# Patient Record
Sex: Female | Born: 1975 | Hispanic: Yes | Marital: Single | Smoking: Former smoker
Health system: Southern US, Community
[De-identification: ages and names within clinical notes are randomized; demographics above are authoritative.]

## PROBLEM LIST (undated history)

## (undated) DIAGNOSIS — E039 Hypothyroidism, unspecified: Secondary | ICD-10-CM

## (undated) DIAGNOSIS — E079 Disorder of thyroid, unspecified: Secondary | ICD-10-CM

## (undated) HISTORY — PX: CERVICAL BIOPSY  W/ LOOP ELECTRODE EXCISION: SUR135

## (undated) HISTORY — PX: NO PAST SURGERIES: SHX2092

## (undated) HISTORY — PX: OVARIAN CYST DRAINAGE: SHX325

---

## 2013-10-24 ENCOUNTER — Other Ambulatory Visit: Payer: Self-pay | Admitting: Nurse Practitioner

## 2013-10-24 DIAGNOSIS — R102 Pelvic and perineal pain: Secondary | ICD-10-CM

## 2013-10-30 ENCOUNTER — Ambulatory Visit
Admission: RE | Admit: 2013-10-30 | Discharge: 2013-10-30 | Disposition: A | Discharge status: Home / Self Care | Payer: No Typology Code available for payment source | Source: Ambulatory Visit | Attending: Nurse Practitioner | Admitting: Nurse Practitioner

## 2013-10-30 DIAGNOSIS — R102 Pelvic and perineal pain: Secondary | ICD-10-CM

## 2017-07-23 ENCOUNTER — Other Ambulatory Visit (HOSPITAL_COMMUNITY): Payer: Self-pay | Admitting: *Deleted

## 2017-07-23 DIAGNOSIS — N644 Mastodynia: Secondary | ICD-10-CM

## 2017-08-12 ENCOUNTER — Ambulatory Visit
Admission: RE | Admit: 2017-08-12 | Discharge: 2017-08-12 | Disposition: A | Discharge status: Home / Self Care | Payer: No Typology Code available for payment source | Source: Ambulatory Visit | Attending: Obstetrics and Gynecology | Admitting: Obstetrics and Gynecology

## 2017-08-12 ENCOUNTER — Ambulatory Visit: Payer: No Typology Code available for payment source

## 2017-08-12 ENCOUNTER — Encounter (HOSPITAL_COMMUNITY): Payer: Self-pay

## 2017-08-12 ENCOUNTER — Ambulatory Visit (HOSPITAL_COMMUNITY)
Admission: RE | Admit: 2017-08-12 | Discharge: 2017-08-12 | Disposition: A | Discharge status: Home / Self Care | Payer: Self-pay | Source: Ambulatory Visit | Attending: Obstetrics and Gynecology | Admitting: Obstetrics and Gynecology

## 2017-08-12 ENCOUNTER — Other Ambulatory Visit (HOSPITAL_COMMUNITY): Payer: Self-pay | Admitting: Obstetrics and Gynecology

## 2017-08-12 VITALS — BP 112/76 | Temp 98.6°F | Wt 168.6 lb

## 2017-08-12 DIAGNOSIS — N644 Mastodynia: Secondary | ICD-10-CM

## 2017-08-12 DIAGNOSIS — N6489 Other specified disorders of breast: Secondary | ICD-10-CM

## 2017-08-12 DIAGNOSIS — Z01419 Encounter for gynecological examination (general) (routine) without abnormal findings: Secondary | ICD-10-CM

## 2017-08-12 HISTORY — DX: Disorder of thyroid, unspecified: E07.9

## 2017-08-12 NOTE — Progress Notes (Signed)
Complaints of bilateral breast pain x 2 months that comes and goes. Patient states the pain has decreased over the past 2-3 weeks. Patient rates the pain at a 4 out of 10.  Pap Smear: Pap smear completed today. Last Pap smear was 3 years ago at [facility] and normal per patient. Per patient has a history of an abnormal Pap smear 7-8 years ago that a colposcopy and LEEP were completed for follow-up. No Pap smear results are in Epic.  Physical exam: Breasts Breasts symmetrical. No skin abnormalities bilateral breasts. No nipple retraction bilateral breasts. No nipple discharge bilateral breasts. No lymphadenopathy. No lumps palpated bilateral breasts. Complaints of pain at left nipple area and at 2 o'clock 7 cm from the nipple on exam. Complaints of right nipple pain on exam. [facility]. Appointment scheduled for Thursday, August 12, 2017 at 0920.  Pelvic/Bimanual   Ext Genitalia No lesions, no swelling and no discharge observed on external genitalia.         Vagina Vagina pink and normal texture. No lesions or discharge observed in vagina.          Cervix Cervix is present. Cervix pink and of normal texture. No discharge observed.     Uterus Uterus is present and palpable. Uterus in normal position and normal size.        Adnexae Bilateral ovaries present and palpable. No tenderness on palpation.          Rectovaginal No rectal exam completed today since patient had no rectal complaints. No skin abnormalities observed on exam.    Smoking History: Patient is a former smoker that per patient quit 1.5 years ago.  Patient Navigation: Patient education provided. Access [facility] services [facility]. Spanish interpreter provided.  Used Spanish interpreter Nira ConnJulia Sowell over the phone.

## 2017-08-12 NOTE — Patient Instructions (Signed)
Explained breast self awareness with Melinda HousekeeperErika Krotz. Let patient know that if today's Pap smear is normal that her next Pap smear is due in one year due to only having one normal Pap smear since abnormal Pap smear. [facility] mamm[facility]. Appointment scheduled for Thursday, August 12, 2017 at 0920. Let patient know will follow up with her within the next couple weeks with results of Pap smear by letter or phone. Melinda Oneal verbalized understanding.  [name], Kathaleen Maserhristine Poll, RN 10:36 AM

## 2017-08-13 ENCOUNTER — Encounter (HOSPITAL_COMMUNITY): Payer: Self-pay | Admitting: *Deleted

## 2017-08-16 LAB — CYTOLOGY - PAP
DIAGNOSIS: NEGATIVE
DIAGNOSIS: REACTIVE
HPV: NOT DETECTED

## 2017-12-02 ENCOUNTER — Encounter (HOSPITAL_COMMUNITY): Payer: Self-pay | Admitting: *Deleted

## 2017-12-02 NOTE — Progress Notes (Signed)
Letter mailed to patient with negative pap smear. HPV was negative. Next pap smear due in five years.

## 2018-01-14 ENCOUNTER — Ambulatory Visit
Admission: RE | Admit: 2018-01-14 | Discharge: 2018-01-14 | Disposition: A | Discharge status: Home / Self Care | Payer: No Typology Code available for payment source | Source: Ambulatory Visit | Attending: Obstetrics and Gynecology | Admitting: Obstetrics and Gynecology

## 2018-01-14 DIAGNOSIS — N6489 Other specified disorders of breast: Secondary | ICD-10-CM

## 2018-06-13 ENCOUNTER — Other Ambulatory Visit: Payer: Self-pay

## 2018-06-13 ENCOUNTER — Inpatient Hospital Stay (HOSPITAL_COMMUNITY)
Admission: AD | Admit: 2018-06-13 | Discharge: 2018-06-13 | Disposition: A | Discharge status: Home / Self Care | Payer: No Typology Code available for payment source | Source: Ambulatory Visit | Attending: Obstetrics and Gynecology | Admitting: Obstetrics and Gynecology

## 2018-06-13 ENCOUNTER — Encounter (HOSPITAL_COMMUNITY): Payer: Self-pay | Admitting: *Deleted

## 2018-06-13 DIAGNOSIS — N764 Abscess of vulva: Secondary | ICD-10-CM

## 2018-06-13 DIAGNOSIS — L739 Follicular disorder, unspecified: Secondary | ICD-10-CM

## 2018-06-13 DIAGNOSIS — Z87891 Personal history of nicotine dependence: Secondary | ICD-10-CM | POA: Insufficient documentation

## 2018-06-13 DIAGNOSIS — R102 Pelvic and perineal pain: Secondary | ICD-10-CM | POA: Insufficient documentation

## 2018-06-13 HISTORY — DX: Hypothyroidism, unspecified: E03.9

## 2018-06-13 LAB — POCT PREGNANCY, URINE: Preg Test, Ur: NEGATIVE

## 2018-06-13 LAB — URINALYSIS, ROUTINE W REFLEX MICROSCOPIC
BILIRUBIN URINE: NEGATIVE
Glucose, UA: NEGATIVE mg/dL
KETONES UR: NEGATIVE mg/dL
LEUKOCYTES UA: NEGATIVE
NITRITE: NEGATIVE
PH: 6 (ref 5.0–8.0)
Protein, ur: NEGATIVE mg/dL
SPECIFIC GRAVITY, URINE: 1.01 (ref 1.005–1.030)

## 2018-06-13 LAB — WET PREP, GENITAL
Clue Cells Wet Prep HPF POC: NONE SEEN
SPERM: NONE SEEN
Trich, Wet Prep: NONE SEEN
YEAST WET PREP: NONE SEEN

## 2018-06-13 MED ORDER — CEPHALEXIN 500 MG PO CAPS: 500.0000 mg | ORAL_CAPSULE | Freq: Three times a day (TID) | ORAL | 0 refills | Status: AC

## 2018-06-13 NOTE — Discharge Instructions (Signed)
Foliculitis (Folliculitis) La foliculitis es una inflamacin de los folculos capilares. La foliculitis ocurre con mayor frecuencia en el cuero cabelludo, los muslos, las piernas, la espalda y las nalgas. Sin embargo, Visual merchandiser parte del cuerpo. CAUSAS Esta afeccin puede ser causada por lo siguiente:  Una infeccin bacteriana (frecuente).  Infecciones por hongos.  Infecciones virales.  Contacto con ciertas sustancias qumicas, especialmente aceites y alquitrn.  Rasurarse o depilarse.  Aplicacin frecuente de cremas o ungentos grasosos en la piel. La foliculitis de duracin prolongada y la foliculitis recurrente pueden ser causadas por bacterias que viven en las narinas. FACTORES DE RIESGO Es ms probable que esta afeccin se desarrolle en las personas que tengan lo siguiente:  Un sistema inmunitario debilitado.  Diabetes.  Obesidad. SNTOMAS Los sntomas de esta afeccin incluyen lo siguiente:  Enrojecimiento.  Inflamacin.  Hinchazn.  Picazn.  Pequeos puntos blancos o amarillos llenos de pus (pstulas) que pican y aparecen sobre una zona enrojecida. Si hay una infeccin que se adentra en el folculo, puede formarse un fornculo.  Un conjunto de fornculos agrupados estrechamente (carbunclo). Estos fornculos tienden a formarse en zonas del cuerpo con mucho pelo y sudorosas. DIAGNSTICO Esta afeccin se diagnostica con un examen de la piel. Para hallar la causa de la afeccin, el mdico puede tomar una [place] de uno de los fornculos o pstulas y Financial risk analyst. TRATAMIENTO El tratamiento de este trastorno puede incluir lo siguiente:  La aplicacin de compresas calientes en la zona afectada.  La administracin de antibiticos o la aplicacin de un antibitico a la piel.  La aplicacin de una solucin antisptica o darse un bao con esta solucin.  La administracin de un medicamento de venta libre para Production designer, theatre/television/film.  La realizacin de un  procedimiento para drenar las pstulas o los fornculos. Se puede realizar si una pstula o un fornculo contienen mucho pus o lquido.  La extraccin del pelo con lser. Se puede realizar para tratar una foliculitis de duracin prolongada. INSTRUCCIONES PARA EL CUIDADO EN EL HOGAR  Si se lo indican, aplique calor en la zona afectada tan frecuentemente como se lo haya indicado el mdico. Use la fuente de calor que el mdico le recomiende, como una compresa de calor hmedo o una almohadilla trmica. ? Coloque una [employer] piel y la fuente de Airline pilot. ? Aplique el calor durante 20 a . ? Retire la fuente de calor si la piel se le pone de color rojo brillante. Esto es muy importante si no puede sentir el dolor, el calor o el fro. Puede correr un riesgo mayor de sufrir quemaduras.  Si le recetaron un antibitico, tmelo como se lo haya indicado el mdico. No deje de usar el antibitico aunque comience a Actor.  Tome los medicamentos de venta libre y los recetados solamente como se lo haya indicado el mdico.  No rasure la piel irritada.  Concurra a todas las visitas de control como se lo haya indicado el mdico. Esto es importante. SOLICITE ATENCIN MDICA DE INMEDIATO SI:  Tiene ms enrojecimiento, hinchazn o dolor en la zona afectada.  Hay lneas rojas que se extienden desde la zona afectada.  Tiene fiebre. Esta informacin no tiene Theme park manager el consejo del mdico. Asegrese de hacerle al mdico cualquier pregunta que tenga. Document Released: 07/27/2005 Document Revised: 01/26/2012 Document Reviewed: 05/17/2015 Elsevier Interactive Patient Education  2018 [employer].   Inda Coke cutneo (Skin Abscess) Un absceso cutneo es una zona infectada en la piel o debajo  de esta que contiene pus y otras sustancias. Un absceso puede aparecer casi en cualquier lugar del cuerpo. Algunos abscesos se abren (rompen) solos. La mayora de ellos siguen empeorando, a  menos que se los trate. La infeccin puede diseminarse hacia otros sitios del cuerpo y en la Protection, lo que puede causar sensacin de Dentist. Generalmente, el tratamiento consiste en el drenaje del absceso. CUIDADOS EN EL HOGAR Cuidado del absceso  Si tiene un absceso que no ha supurado, [employer] un pao hmedo, tibio y limpio varias veces por Futures trader. Hgalo como se lo haya indicado el mdico.  Siga las indicaciones del mdico en lo que respecta al cuidado del absceso. Asegrese de lo siguiente: ? [place] el absceso con una venda (vendaje). ? Cambie la venda o la gasa como se lo haya indicado el mdico. ? Lvese las manos con agua y jabn antes de cambiar el vendaje o la gasa. Use un desinfectante para manos si no dispone de [place] y [place].  Contrlese el [employer] para detectar si la infeccin empeora. Est atento a los siguientes signos: ? Aumento del enrojecimiento, de la hinchazn o del dolor. ? Ms lquido [place]. ? Calor. ? Mal olor o aumento del pus. Medicamentos  [employer] de venta libre y los recetados solamente como se lo haya indicado el mdico.  Si le recetaron un antibitico, tmelo como se lo haya indicado el mdico. No deje de tomar los antibiticos aunque comience a sentirse mejor. Instrucciones generales  Para evitar la propagacin de la infeccin: ? No comparta artculos de higiene personal, toallas o jacuzzis con Economist. ? Evite el contacto con la piel de [employer].  Concurra a todas las visitas de control como se lo haya indicado el mdico. Esto es importante. SOLICITE AYUDA SI:  Aumentan el enrojecimiento, la hinchazn o el dolor alrededor del absceso.  Aumenta la cantidad de lquido o de sangre que sale del absceso.  Siente el absceso caliente cuando lo toca.  Aumenta la cantidad de pus o percibe mal [employer] del absceso.  Tiene fiebre.  Tiene dolor muscular.  Tiene escalofros.  Se siente  mal. SOLICITE AYUDA DE INMEDIATO SI:  Siente mucho dolor (intenso).  Observa lneas rojas que se extienden desde el absceso. Esta informacin no tiene Theme park manager el consejo del mdico. Asegrese de hacerle al mdico cualquier pregunta que tenga. Document Released: 10/23/2008 Document Revised: 01/26/2012 Document Reviewed: 06/05/2015 Elsevier Interactive Patient Education  [employer].

## 2018-06-13 NOTE — MAU Provider Note (Addendum)
History     CSN: [id number]  Arrival date and time: 06/13/18 1449   None     No chief complaint on file.  HPI Melinda Oneal is 42 y.o.  presenting with concern of a "pimple" on the vulva that began 1 week ago and swelling/pain in the vagina began yesterday.  Neg for fever, chills.  Neg for vaginal discharge.  1 sexual partner.  Was seen in Jan for breast, pelvic and pap at clinic.      Past Medical History:  Diagnosis Date  . Hypothyroidism   . Thyroid disease     Past Surgical History:  Procedure Laterality Date  . CERVICAL BIOPSY  W/ LOOP ELECTRODE EXCISION    . NO PAST SURGERIES    . OVARIAN CYST DRAINAGE      Family History  Problem Relation Age of Onset  . Cancer Mother        pancreatic    Social History   Tobacco Use  . Smoking status: Former Smoker    Last attempt to quit: 06/13/2016    Years since quitting: 2.0  . Smokeless tobacco: Never Used  Substance Use Topics  . Alcohol use: Yes    Comment: rarely  . Drug use: No    Allergies: No Known Allergies  Medications Prior to Admission  Medication Sig Dispense Refill Last Dose  . levothyroxine (SYNTHROID, LEVOTHROID) 100 MCG tablet Take 100 mcg by mouth daily before breakfast.       Review of Systems  Constitutional: Negative for chills and fever.  Genitourinary: Positive for vaginal pain. Negative for vaginal bleeding and vaginal discharge.       Painful area on the left side of vagina.  Introital discomfort Small pimple in the pubic hair.  Psychiatric/Behavioral: Negative for agitation.   Physical Exam   Blood pressure 120/75, pulse 75, temperature 99.1 F (37.3 C), temperature source Oral, resp. rate 18, weight 75.4 kg, SpO2 100 %.  Physical Exam  Nursing note and vitals reviewed. Constitutional: She is oriented to person, place, and time. She appears well-developed and well-nourished. No distress.  HENT:  Head: Normocephalic.  Neck: Normal range of motion.  Cardiovascular: Normal  rate.  Respiratory: Effort normal.  GI: Soft.  Genitourinary: Rectum normal. There is no rash, tenderness or lesion on the right labia. There is tenderness (there is a pea size firm nodule that is tender between the labia majora and minora without cellutlitis of the surrounding  tissue or oozing. ) on the left labia. There is no rash or lesion on the left labia. No erythema or tenderness in the vagina. No vaginal discharge found.  Genitourinary Comments: There is a small area under hair shaft in the suprapubic area that began to drain on exam.  Neg for cellulitis or tenderness.    Neurological: She is alert and oriented to person, place, and time.  Skin: Skin is warm and dry.  Psychiatric: She has a normal mood and affect. Her behavior is normal.   Results for orders placed or performed during the hospital encounter of 06/13/18 (from the past 24 hour(s))  Urinalysis, Routine w reflex microscopic     Status: Abnormal   Collection Time: 06/13/18  4:04 PM  Result Value Ref Range   Color, Urine YELLOW YELLOW   APPearance CLEAR CLEAR   Specific Gravity, Urine 1.010 1.005 - 1.030   pH 6.0 5.0 - 8.0   Glucose, UA NEGATIVE NEGATIVE mg/dL   Hgb urine dipstick SMALL (A) NEGATIVE  Bilirubin Urine NEGATIVE NEGATIVE   Ketones, ur NEGATIVE NEGATIVE mg/dL   Protein, ur NEGATIVE NEGATIVE mg/dL   Nitrite NEGATIVE NEGATIVE   Leukocytes, UA NEGATIVE NEGATIVE   RBC / HPF 0-5 0 - 5 RBC/hpf   WBC, UA 0-5 0 - 5 WBC/hpf   Bacteria, UA RARE (A) NONE SEEN   Squamous Epithelial / LPF 0-5 0 - 5   Mucus PRESENT   Pregnancy, urine POC     Status: None   Collection Time: 06/13/18  4:08 PM  Result Value Ref Range   Preg Test, Ur NEGATIVE NEGATIVE   MAU Course  Procedures  MDM MSE Exam  Assessment and Plan  A:  Left labia/vulvarl abscess without cellutlitis       Suprapubic folliculitis  P:  Instructed to soak in warm water 3 X day       Avoid squeezing or trying to "pop" the abscess       Rx for  Kelfex 500mg  tid X 7 days       Return for worsening sxs. Dennison Mascot [place] 06/13/2018, 5:09 PM

## 2018-06-13 NOTE — MAU Note (Signed)
Yesterday, she started with discomfort in 'her parts'.  Is swollen and it hurts.  Burning, esp when she urinates.

## 2018-06-14 LAB — GC/CHLAMYDIA PROBE AMP (~~LOC~~) NOT AT ARMC
CHLAMYDIA, DNA PROBE: NEGATIVE
NEISSERIA GONORRHEA: NEGATIVE

## 2018-10-17 ENCOUNTER — Telehealth: Payer: Self-pay | Admitting: Family Medicine

## 2018-10-17 NOTE — Telephone Encounter (Signed)
Called the patient to inform of upcoming appointment. Left a voicemail stating to the patient to call our office. Sending a reminder letter.

## 2018-10-18 ENCOUNTER — Telehealth: Payer: Self-pay | Admitting: Internal Medicine

## 2018-10-27 ENCOUNTER — Telehealth: Payer: Self-pay | Admitting: Family Medicine

## 2018-10-27 NOTE — Telephone Encounter (Signed)
The patient called in regards to the upcoming appointment. She wanted to know who sent the referral and stated she and the doctor had a different understanding of the referral however she will keep the appointment.

## 2018-10-28 ENCOUNTER — Encounter: Payer: Self-pay | Admitting: *Deleted

## 2018-11-09 ENCOUNTER — Encounter: Payer: No Typology Code available for payment source | Admitting: Family Medicine

## 2018-12-07 NOTE — Telephone Encounter (Signed)
Opened in error

## 2019-09-27 ENCOUNTER — Ambulatory Visit (INDEPENDENT_AMBULATORY_CARE_PROVIDER_SITE_OTHER): Payer: Self-pay | Admitting: Obstetrics and Gynecology

## 2019-09-27 ENCOUNTER — Other Ambulatory Visit: Payer: Self-pay

## 2019-09-27 ENCOUNTER — Encounter: Payer: Self-pay | Admitting: Obstetrics and Gynecology

## 2019-09-27 VITALS — Wt 148.0 lb

## 2019-09-27 DIAGNOSIS — Z1231 Encounter for screening mammogram for malignant neoplasm of breast: Secondary | ICD-10-CM

## 2019-09-27 DIAGNOSIS — G8929 Other chronic pain: Secondary | ICD-10-CM

## 2019-09-27 DIAGNOSIS — R1032 Left lower quadrant pain: Secondary | ICD-10-CM

## 2019-09-27 DIAGNOSIS — Z113 Encounter for screening for infections with a predominantly sexual mode of transmission: Secondary | ICD-10-CM

## 2019-09-27 NOTE — Progress Notes (Signed)
44 yo referred for evaluation of left lower quadrant pain. Patient reports the presence of this intermittent pain for the past 5 years. She states the pain is worst with her periods. It is mildly alleviated with ibuprofen. She reports a monthly period lasting 5 days. She denies any pain with intercourse. She admits that the pain seems to be triggered with movements of her left leg. Patient denies constipation  Past Medical History:  Diagnosis Date  . Hypothyroidism   . Thyroid disease    Past Surgical History:  Procedure Laterality Date  . CERVICAL BIOPSY  W/ LOOP ELECTRODE EXCISION    . NO PAST SURGERIES    . OVARIAN CYST DRAINAGE     Family History  Problem Relation Age of Onset  . Cancer Mother        pancreatic   Social History   Tobacco Use  . Smoking status: Former Smoker    Quit date: 06/13/2016    Years since quitting: 3.2  . Smokeless tobacco: Never Used  Substance Use Topics  . Alcohol use: Yes    Comment: rarely  . Drug use: No   ROS See pertinent in HPI  GENERAL: Well-developed, well-nourished female in no acute distress.  ABDOMEN: Soft, nontender, nondistended. No organomegaly. PELVIC: Normal external female genitalia. Vagina is pink and rugated.  Normal discharge. Normal appearing cervix. Uterus is normal in size.  No adnexal mass or tenderness. EXTREMITIES: No cyanosis, clubbing, or edema, 2+ distal pulses.  A/P 44 yo with LLQ pain - Normal pelvic exam - wet prep collected - pelvic ultrasound ordered - screening mammogram ordered - If normal ultrasound, discussed other potential etiologies such as a pinched nerve as her pain seems to be triggered by movement or her intestines - patient will be contacted with results

## 2019-09-28 LAB — CERVICOVAGINAL ANCILLARY ONLY
Bacterial Vaginitis (gardnerella): NEGATIVE
Candida Glabrata: NEGATIVE
Candida Vaginitis: NEGATIVE
Comment: NEGATIVE
Comment: NEGATIVE
Comment: NEGATIVE

## 2019-10-03 ENCOUNTER — Other Ambulatory Visit: Payer: Self-pay

## 2019-10-03 ENCOUNTER — Telehealth: Payer: Self-pay

## 2019-10-03 ENCOUNTER — Ambulatory Visit (HOSPITAL_COMMUNITY)
Admission: RE | Admit: 2019-10-03 | Discharge: 2019-10-03 | Disposition: A | Discharge status: Home / Self Care | Payer: Self-pay | Source: Ambulatory Visit | Attending: Obstetrics and Gynecology | Admitting: Obstetrics and Gynecology

## 2019-10-03 DIAGNOSIS — R1032 Left lower quadrant pain: Secondary | ICD-10-CM | POA: Insufficient documentation

## 2019-10-03 DIAGNOSIS — G8929 Other chronic pain: Secondary | ICD-10-CM | POA: Insufficient documentation

## 2019-10-03 NOTE — Telephone Encounter (Signed)
-----   Message from Catalina Antigua, MD sent at 10/03/2019  2:40 PM EST ----- Please inform patient of normal pelvic ultrasound with normal uterus and ovaries. She needs to follow up with a PCP for the evaluation of her chronic left lower quadrant pain  Thanks  [name]

## 2019-10-03 NOTE — Telephone Encounter (Signed)
Called pt using Spanish [employer]  id# [phone], to advise pt of [place] results, no answer, left VM for pt to call back to get results.

## 2019-10-14 ENCOUNTER — Ambulatory Visit: Payer: No Typology Code available for payment source | Attending: Internal Medicine

## 2019-10-14 DIAGNOSIS — Z23 Encounter for immunization: Secondary | ICD-10-CM | POA: Insufficient documentation

## 2019-10-14 NOTE — Progress Notes (Signed)
   Covid-19 Vaccination Clinic  Name:  Melinda Oneal    MRN: [id number] DOB: [birth year]  10/14/2019  Melinda Oneal was observed post Covid-19 immunization for 15 minutes and at the end of her post vaccination  check out verbalized a headache. She has a history of headaches and this one doesn't feel any different from previous ones. Feels like a throbbing in her frontal lobe, does not radiate. Denies N/V, C.P. or SOB. Vital signs: B/P- 131/74, P- 71, R- 18, O2 sat 99% on room air. 11:23am- States HA is resolved. Stood up on feet, no dizziness or lightheadedness, B/P 119/75,, P- 66, R- 18, O2 sat 100% on room air. 11:25am-discharged from vaccine clinic, states she feel fine, HA remains resolved.  She was provided with Vaccine Information Sheet and instruction to access the V-Safe system.   Melinda Oneal was instructed to call 911 with any severe reactions post vaccine: Marland Kitchen Difficulty breathing  . Swelling of face and throat  . A fast heartbeat  . A bad rash all over body  . Dizziness and weakness   Immunizations Administered    Name Date Dose VIS Date Route   Pfizer COVID-19 Vaccine 10/14/2019 10:54 AM 0.3 mL 07/21/2019 Intramuscular   Manufacturer: [employer], [employer]   Lot: [id number]   NDC: [address]-7

## 2019-11-04 ENCOUNTER — Ambulatory Visit: Payer: Self-pay | Attending: Internal Medicine

## 2019-11-04 DIAGNOSIS — Z23 Encounter for immunization: Secondary | ICD-10-CM

## 2019-11-04 NOTE — Progress Notes (Signed)
   Covid-19 Vaccination Clinic  Name:  Melinda Oneal    MRN: [id number] DOB: 06-20-1976  11/04/2019  Melinda Oneal was observed post Covid-19 immunization for 15 minutes without incident. She was provided with Vaccine Information Sheet and instruction to access the V-Safe system.   Melinda Oneal was instructed to call 911 with any severe reactions post vaccine: Marland Kitchen Difficulty breathing  . Swelling of face and throat  . A fast heartbeat  . A bad rash all over body  . Dizziness and weakness   Immunizations Administered    Name Date Dose VIS Date Route   Pfizer COVID-19 Vaccine 11/04/2019 11:55 AM 0.3 mL 07/21/2019 Intramuscular   Manufacturer: [employer], [employer]   Lot: [id number]   NDC: [address]-3

## 2019-11-14 ENCOUNTER — Other Ambulatory Visit: Payer: Self-pay | Admitting: Family Medicine

## 2019-11-14 DIAGNOSIS — R102 Pelvic and perineal pain: Secondary | ICD-10-CM

## 2019-11-15 ENCOUNTER — Ambulatory Visit: Payer: No Typology Code available for payment source

## 2019-11-30 ENCOUNTER — Other Ambulatory Visit: Payer: Self-pay | Admitting: Family Medicine

## 2019-12-11 ENCOUNTER — Ambulatory Visit
Admission: RE | Admit: 2019-12-11 | Discharge: 2019-12-11 | Disposition: A | Discharge status: Home / Self Care | Payer: No Typology Code available for payment source | Source: Ambulatory Visit | Attending: Family Medicine | Admitting: Family Medicine

## 2019-12-11 DIAGNOSIS — R102 Pelvic and perineal pain: Secondary | ICD-10-CM

## 2019-12-11 MED ORDER — IOPAMIDOL (ISOVUE-300) INJECTION 61%
100.0000 mL | Freq: Once | INTRAVENOUS | Status: AC | PRN
  Administered 2019-12-11: 100 mL via INTRAVENOUS

## 2020-02-21 ENCOUNTER — Other Ambulatory Visit: Payer: Self-pay | Admitting: *Deleted

## 2020-02-21 DIAGNOSIS — Z1231 Encounter for screening mammogram for malignant neoplasm of breast: Secondary | ICD-10-CM

## 2020-03-12 ENCOUNTER — Other Ambulatory Visit: Payer: Self-pay

## 2020-03-12 ENCOUNTER — Ambulatory Visit
Admission: RE | Admit: 2020-03-12 | Discharge: 2020-03-12 | Disposition: A | Discharge status: Home / Self Care | Payer: No Typology Code available for payment source | Source: Ambulatory Visit | Attending: Obstetrics and Gynecology | Admitting: Obstetrics and Gynecology

## 2020-03-12 DIAGNOSIS — Z1231 Encounter for screening mammogram for malignant neoplasm of breast: Secondary | ICD-10-CM

## 2021-06-11 ENCOUNTER — Other Ambulatory Visit: Payer: Self-pay

## 2021-06-11 DIAGNOSIS — Z1231 Encounter for screening mammogram for malignant neoplasm of breast: Secondary | ICD-10-CM

## 2021-07-29 ENCOUNTER — Ambulatory Visit: Payer: No Typology Code available for payment source

## 2021-08-14 ENCOUNTER — Ambulatory Visit: Payer: No Typology Code available for payment source

## 2021-08-28 ENCOUNTER — Ambulatory Visit: Payer: No Typology Code available for payment source

## 2021-09-04 ENCOUNTER — Ambulatory Visit
Admission: RE | Admit: 2021-09-04 | Discharge: 2021-09-04 | Disposition: A | Discharge status: Home / Self Care | Payer: No Typology Code available for payment source | Source: Ambulatory Visit | Attending: Obstetrics and Gynecology | Admitting: Obstetrics and Gynecology

## 2021-09-04 ENCOUNTER — Encounter (INDEPENDENT_AMBULATORY_CARE_PROVIDER_SITE_OTHER): Payer: Self-pay

## 2021-09-04 ENCOUNTER — Other Ambulatory Visit: Payer: Self-pay

## 2021-09-04 ENCOUNTER — Ambulatory Visit: Payer: Self-pay | Admitting: *Deleted

## 2021-09-04 VITALS — BP 110/68 | Wt 169.0 lb

## 2021-09-04 DIAGNOSIS — Z1231 Encounter for screening mammogram for malignant neoplasm of breast: Secondary | ICD-10-CM

## 2021-09-04 DIAGNOSIS — Z01419 Encounter for gynecological examination (general) (routine) without abnormal findings: Secondary | ICD-10-CM

## 2021-09-04 NOTE — Progress Notes (Signed)
[name] is a 46 y.o. [id number] female who presents to [facility] clinic today with complaint of left outer breast pain x 2-3 months that happens once every two weeks. Patient rates the pain at a 2-3 out of 10. On patients previous BCCCP exam 08/12/2017 she complained of bilateral breast pain. Patient had a diagnostic mammogram and left breast ultrasound 08/12/2017 for follow up that 42-month follow up was recommended for probably benign breast asymmetry. Patient had a left breast diagnostic mammogram completed 01/14/2018 for follow up that was negative. Last mammogram was completed 03/12/2020 and was negative.   Pap Smear: Pap smear completed today. Last Pap smear was 08/12/2017 in [facility] clinic that was normal with negative HPV. Per patient her previous Pap smear was completed around 2016 at [facility] and normal per patient. Per patient has a history of an abnormal Pap smear around 2011 that a colposcopy and LEEP were completed for follow-up. Patient stated she has only had two normal Pap smears since LEEP. Last Pap smear result is available in Epic.   Physical exam: Breasts Breasts symmetrical. No skin abnormalities bilateral breasts. No nipple retraction bilateral breasts. No nipple discharge bilateral breasts. No lymphadenopathy. No lumps palpated bilateral breasts. Complaints of left lower breast tenderness on exam.  MM DIAG BREAST TOMO UNI LEFT  Result Date: 01/14/2018 CLINICAL DATA:  46 year old patient presents for follow-up of probably benign asymmetry in the slightly medial/central left breast seen in the CC view on her baseline mammogram August 12, 2017. Spanish interpreter was present to give the patient's results to her today. EXAM: DIGITAL DIAGNOSTIC UNILATERAL LEFT MAMMOGRAM WITH CAD AND TOMO COMPARISON:  August 12, 2017 ACR Breast Density Category b: There are scattered areas of fibroglandular density. FINDINGS: The previously described asymmetry in the slightly medial/central left breast in  January 2019 is not apparent today. Normal scattered fibroglandular densities are seen in the left breast. No mass, persistent asymmetry, or architectural distortion is identified. There are no suspicious microcalcifications Mammographic images were processed with CAD. IMPRESSION: No evidence of malignancy in the left breast RECOMMENDATION: Bilateral screening mammogram is recommended in January 2020 I have discussed the findings and recommendations with the patient. Results were also provided in writing at the conclusion of the visit. If applicable, a reminder letter will be sent to the patient regarding the next appointment. BI-RADS CATEGORY  1: Negative. Electronically Signed   By: Britta Mccreedy M.D.   On: 01/14/2018 10:37   MM DIAG BREAST TOMO BILATERAL  Result Date: 08/12/2017 CLINICAL DATA:  46 year old female with an area of pain involving the outer left breast as well as retroareolar left breast. EXAM: 2D DIGITAL DIAGNOSTIC BILATERAL MAMMOGRAM WITH CAD AND ADJUNCT TOMO LEFT BREAST ULTRASOUND COMPARISON:  None. ACR Breast Density Category b: There are scattered areas of fibroglandular density. FINDINGS: No suspicious masses or calcifications seen in the right breast. There is an asymmetry in the central to slightly inner left breast best visualized on the CC view. Spot compression tangential tomograms were performed over the most focal pain in the upper-outer left breast with no abnormalities identified. Mammographic images were processed with CAD. Targeted ultrasound of the left breast was performed. No suspicious masses or abnormalities identified in the region of tenderness in the outer left breast. Targeted ultrasound of the entire inner left breast from the approximate 7 to 11 o'clock position was performed with only heterogeneous fibroglandular tissue identified. IMPRESSION: 1. No mammographic or sonographic correlate for the tenderness involving the outer left breast and  extending to the  retroareolar left breast. 2. There are no findings of malignancy in either breast. 3. Probably benign asymmetry in the central to slightly inner left breast likely representing focal fibroglandular tissue. RECOMMENDATION: Diagnostic mammography of the left breast with left breast ultrasound in 6 months to demonstrate stability of the benign appearing asymmetry seen in the slightly inner left breast best visualized on the CC view. I have discussed the findings and recommendations with the patient. Results were also provided in writing at the conclusion of the visit. If applicable, a reminder letter will be sent to the patient regarding the next appointment. BI-RADS CATEGORY  3: Probably benign. Electronically Signed   By: Edwin Cap M.D.   On: 08/12/2017 10:25   MS DIGITAL SCREENING TOMO BILATERAL  Result Date: 03/14/2020 CLINICAL DATA:  Screening. EXAM: DIGITAL SCREENING BILATERAL MAMMOGRAM WITH TOMO AND CAD COMPARISON:  Previous exam(s). ACR Breast Density Category b: There are scattered areas of fibroglandular density. FINDINGS: There are no findings suspicious for malignancy. Images were processed with CAD. IMPRESSION: No mammographic evidence of malignancy. A result letter of this screening mammogram will be mailed directly to the patient. RECOMMENDATION: Screening mammogram in one year. (Code:[id number]-B-01Y) BI-RADS CATEGORY  1: Negative. Electronically Signed   By: Amie Portland M.D.   On: 03/14/2020 11:08       Pelvic/Bimanual Ext Genitalia No lesions, no swelling and no discharge observed on external genitalia.        Vagina Vagina pink and normal texture. No lesions or discharge observed in vagina.        Cervix Cervix is present. Cervix pink and of normal texture. No discharge observed.    Uterus Uterus is present and palpable. Uterus in normal position and normal size.        Adnexae Bilateral ovaries present and palpable. No tenderness on palpation.         Rectovaginal No rectal  exam completed today since patient had no rectal complaints. No skin abnormalities observed on exam.     Smoking History: Patient is a former smoker that quit 06/13/2016.   Patient Navigation: Patient education provided. Access to services provided for patient through [place] program. Spanish interpreter Natale Lay from [facility] provided.   Colorectal Cancer Screening: Per patient has never had colonoscopy completed. Per patient completed a FIT Test at her PCP's office in July 2022. No complaints today.    Breast and Cervical Cancer Risk Assessment: Patient does not have family history of breast cancer, known genetic mutations, or radiation treatment to the chest before age 80. Per patient has history of cervical dysplasia. Patient has no history of being immunocompromised or DES exposure in-utero.  Risk Assessment     Risk Scores       09/04/2021   Last edited by: Narda Rutherford, LPN   5-year risk: 0.8 %   Lifetime risk: 8 %           A: BCCCP exam with pap smear Complaint of left breast pain.  P: Referred patient to the [facility] of [facility] for a screening mammogram on mobile unit. Appointment scheduled Thursday, September 04, 2021 at 1400.  Priscille Heidelberg, RN 09/04/2021 1:33 PM

## 2021-09-04 NOTE — Patient Instructions (Signed)
Explained breast self awareness with Melinda Oneal. Pap smear completed today. Let her know that her next Pap smear will due based on the result of today's Pap smear. Referred patient to the [facility] of [facility] for a screening mammogram on mobile unit. Appointment scheduled Thursday, September 04, 2021 at 1400. Patient aware of appointment and will be there. Let patient know will follow up with her within the next couple weeks with results of her Pap smear by phone. Informed patient that the [facility] will follow up with her within the next couple of weeks with results of her mammogram by letter or phone. Melinda Oneal verbalized understanding.  Melinda Oneal, [name], RN 1:33 PM

## 2021-09-08 LAB — CYTOLOGY - PAP
Comment: NEGATIVE
Diagnosis: NEGATIVE
High risk HPV: NEGATIVE

## 2021-09-09 ENCOUNTER — Telehealth: Payer: Self-pay

## 2021-09-09 NOTE — Telephone Encounter (Signed)
Via Natale Lay, Spanish Interpreter, Patient informed negative Pap/HPV results, next pap due in 3 years. Patient verbalized understanding.

## 2022-02-14 IMAGING — MG MM DIGITAL SCREENING BILAT W/ TOMO AND CAD
6 of 10 series · 6 of 30 positions shown · non-contrast
Comparison: Previous exam(s).

CLINICAL DATA: Screening. Technologist indicates best
positioning/images possible.

EXAM:
DIGITAL SCREENING BILATERAL MAMMOGRAM WITH TOMOSYNTHESIS AND CAD
TECHNIQUE: Bilateral screening digital craniocaudal and mediolateral oblique
mammograms were obtained. Bilateral screening digital breast
tomosynthesis was performed. The images were evaluated with
computer-aided detection.

[L MLO synth-2D (1 of 2)]
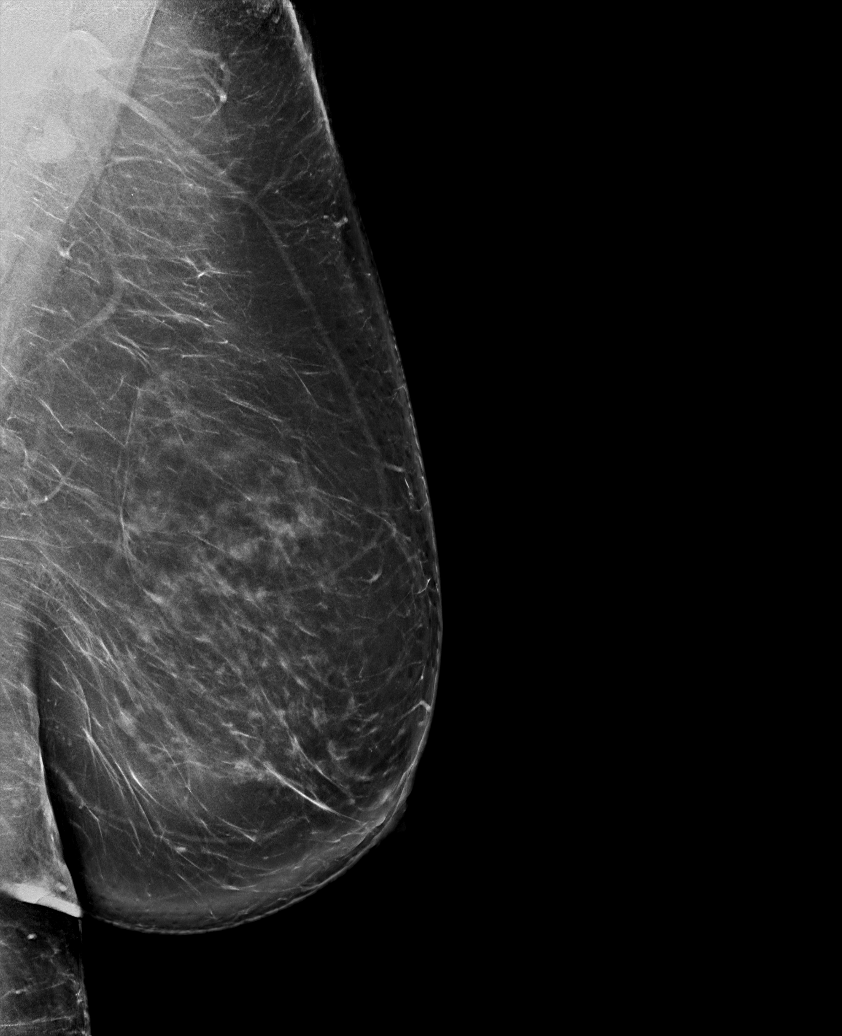

[L CC synth-2D]
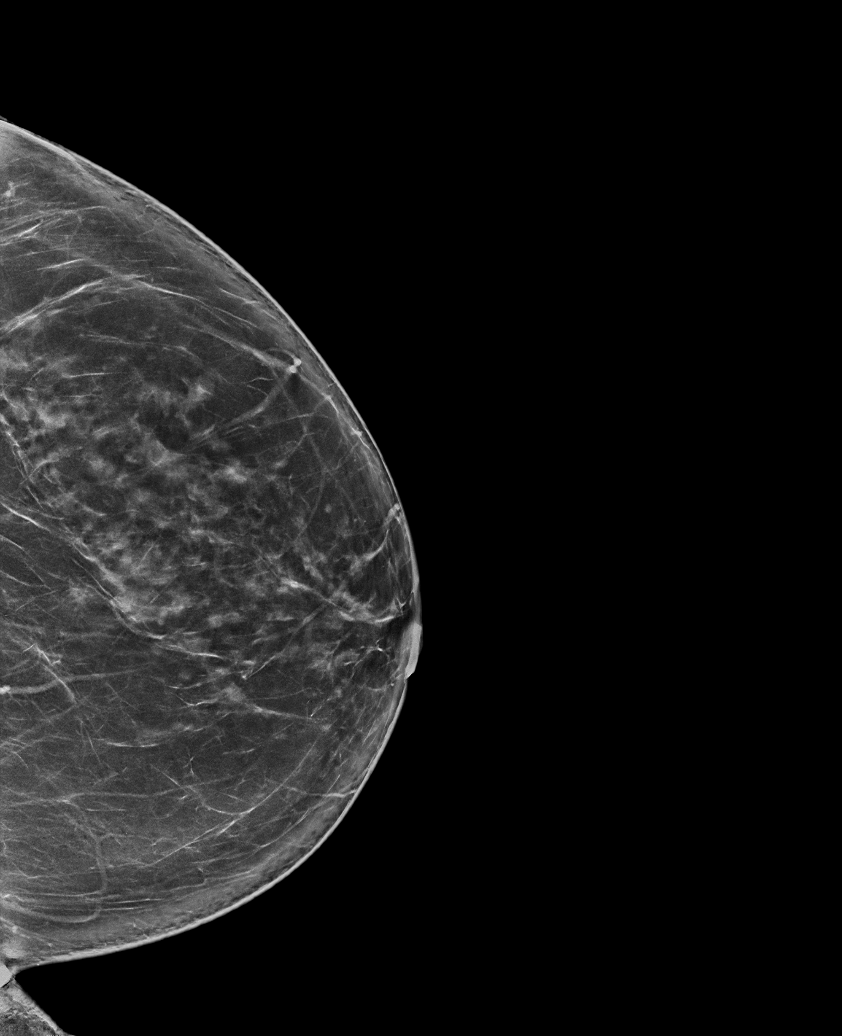

[L MLO synth-2D (2 of 2)]
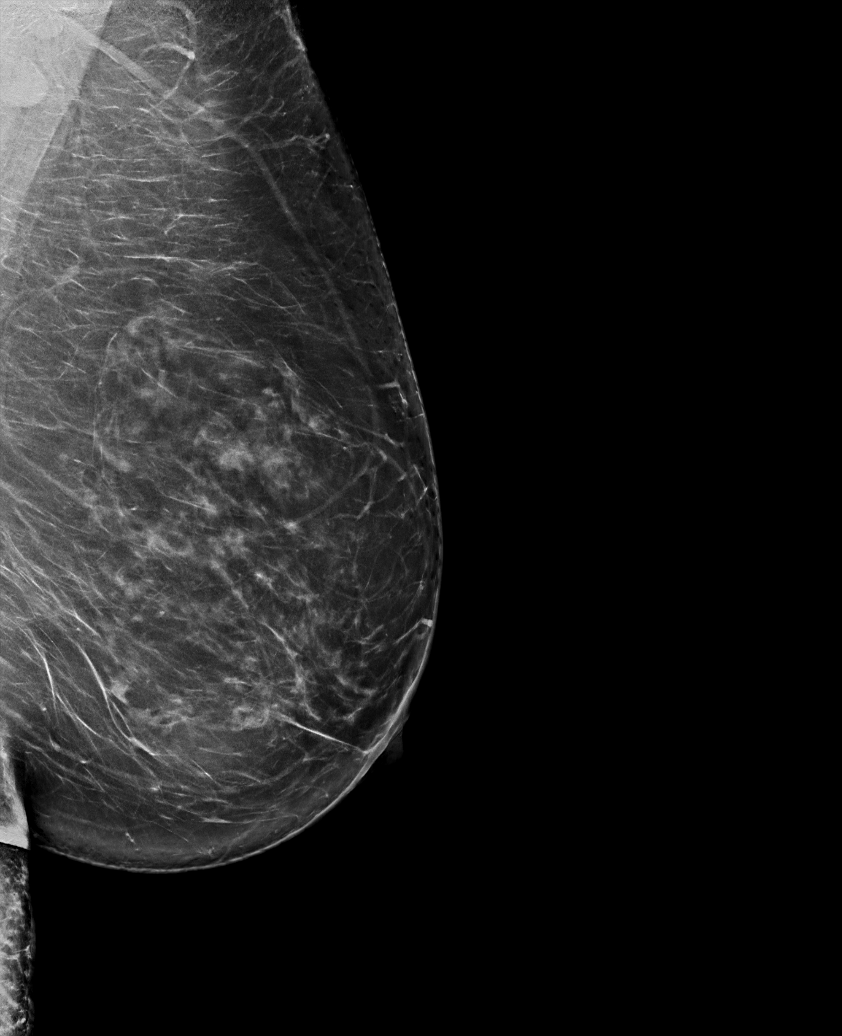

[R MLO synth-2D]
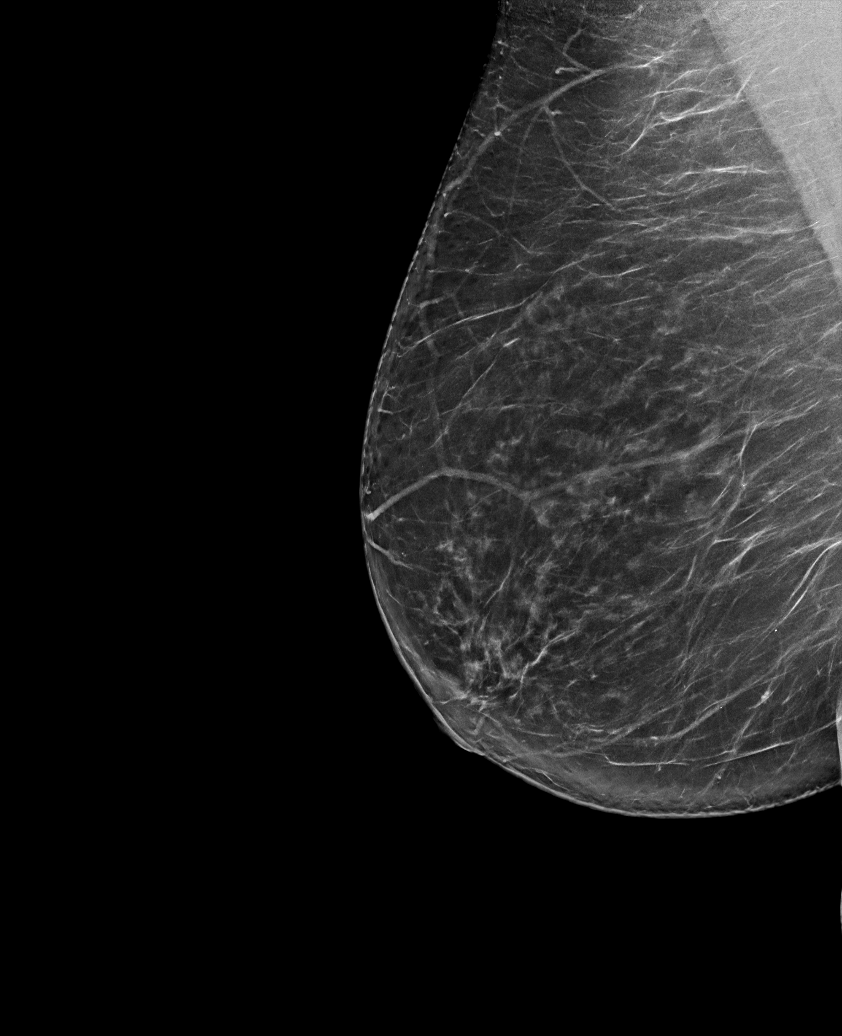

[R CC synth-2D]
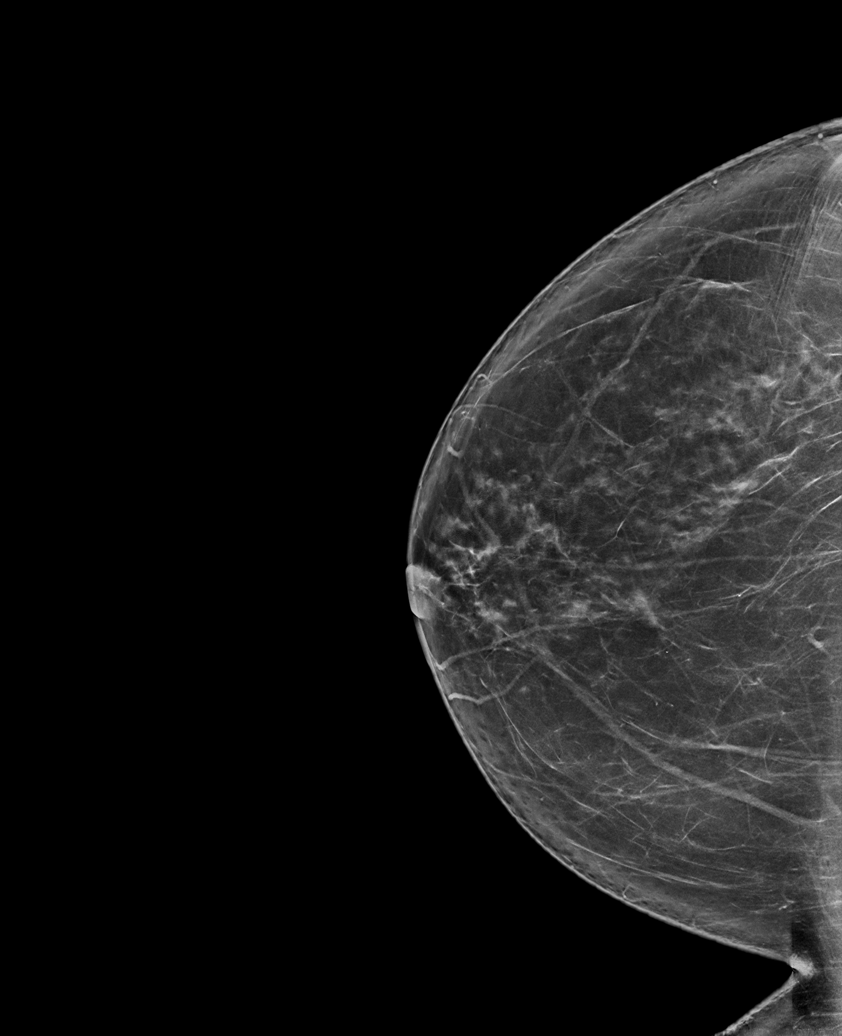

[L MLO tomo · tomo slice 45/90.0]
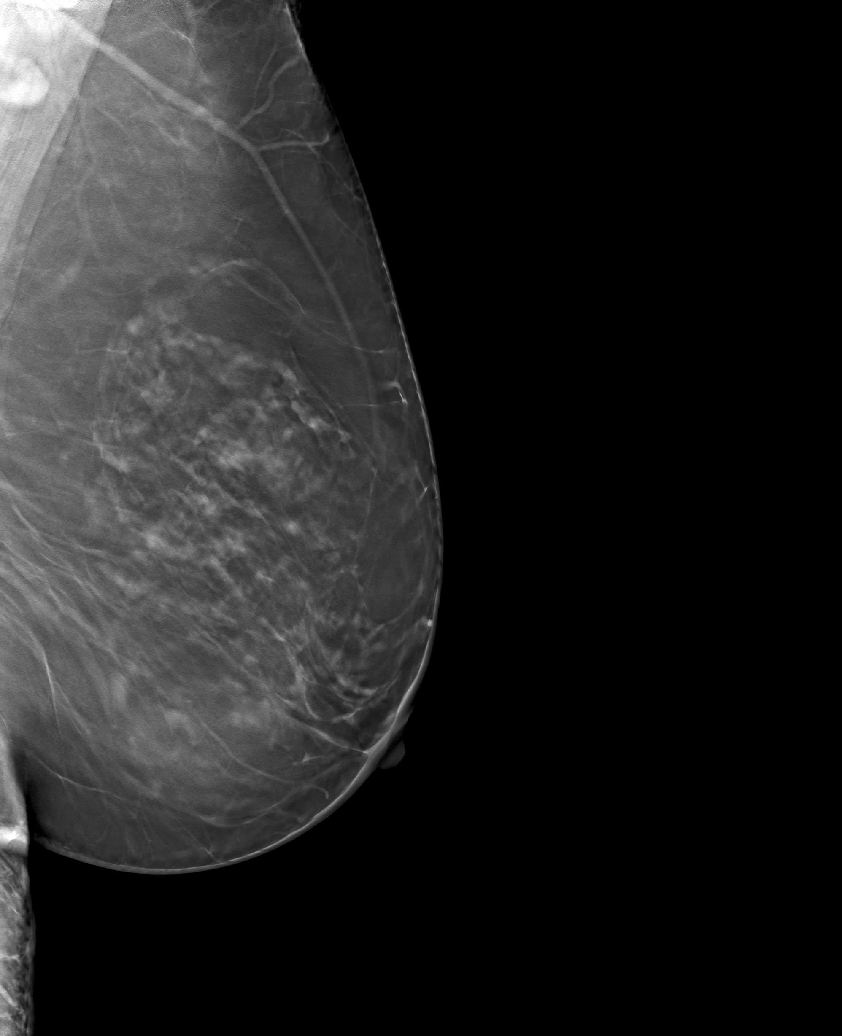

[6 of 30 positions shown; findings below may reference images not displayed]

ACR Breast Density Category b: There are scattered areas of
fibroglandular density.
FINDINGS: There are no findings suspicious for malignancy.
IMPRESSION: No mammographic evidence of malignancy. A result letter of this
screening mammogram will be mailed directly to the patient.

RECOMMENDATION:
Screening mammogram in one year. (Code:[id number]-[id number])

BI-RADS CATEGORY  1: Negative.

## 2022-06-23 ENCOUNTER — Other Ambulatory Visit: Payer: Self-pay | Admitting: Physician Assistant

## 2022-06-23 ENCOUNTER — Ambulatory Visit
Admission: RE | Admit: 2022-06-23 | Discharge: 2022-06-23 | Disposition: A | Discharge status: Home / Self Care | Payer: No Typology Code available for payment source | Source: Ambulatory Visit | Attending: Physician Assistant | Admitting: Physician Assistant

## 2022-06-23 ENCOUNTER — Encounter: Payer: Self-pay | Admitting: Physician Assistant

## 2022-06-23 DIAGNOSIS — R1012 Left upper quadrant pain: Secondary | ICD-10-CM

## 2022-07-06 ENCOUNTER — Ambulatory Visit
Admission: RE | Admit: 2022-07-06 | Discharge: 2022-07-06 | Disposition: A | Discharge status: Home / Self Care | Payer: No Typology Code available for payment source | Source: Ambulatory Visit | Attending: Physician Assistant | Admitting: Physician Assistant

## 2022-07-06 DIAGNOSIS — R1012 Left upper quadrant pain: Secondary | ICD-10-CM

## 2022-10-14 ENCOUNTER — Other Ambulatory Visit: Payer: Self-pay

## 2022-10-14 DIAGNOSIS — Z1231 Encounter for screening mammogram for malignant neoplasm of breast: Secondary | ICD-10-CM

## 2022-11-19 ENCOUNTER — Ambulatory Visit
Admission: RE | Admit: 2022-11-19 | Discharge: 2022-11-19 | Disposition: A | Discharge status: Home / Self Care | Payer: No Typology Code available for payment source | Source: Ambulatory Visit | Attending: Obstetrics and Gynecology | Admitting: Obstetrics and Gynecology

## 2022-11-19 ENCOUNTER — Ambulatory Visit: Payer: No Typology Code available for payment source

## 2022-11-19 ENCOUNTER — Ambulatory Visit: Payer: Self-pay | Admitting: Hematology and Oncology

## 2022-11-19 VITALS — BP 135/83 | Ht <= 58 in | Wt 165.0 lb

## 2022-11-19 DIAGNOSIS — Z1231 Encounter for screening mammogram for malignant neoplasm of breast: Secondary | ICD-10-CM

## 2022-11-19 DIAGNOSIS — Z1211 Encounter for screening for malignant neoplasm of colon: Secondary | ICD-10-CM

## 2022-11-19 NOTE — Progress Notes (Signed)
[name] is a 47 y.o. female who presents to [facility] clinic today with no complaints.    Pap Smear: Pap not smear completed today. Last Pap smear was 2023 and was normal. Per patient has no history of an abnormal Pap smear. Last Pap smear result is available in Epic. History of abnormal treated with cryotherapy 10 years ago. She has had 3 normal since then.   Physical exam: Breasts Breasts symmetrical. No skin abnormalities bilateral breasts. No nipple retraction bilateral breasts. No nipple discharge bilateral breasts. No lymphadenopathy. No lumps palpated bilateral breasts.    MM 3D SCREEN BREAST BILATERAL  Result Date: 09/05/2021 CLINICAL DATA:  Screening. Technologist indicates best positioning/images possible. EXAM: DIGITAL SCREENING BILATERAL MAMMOGRAM WITH TOMOSYNTHESIS AND CAD TECHNIQUE: Bilateral screening digital craniocaudal and mediolateral oblique mammograms were obtained. Bilateral screening digital breast tomosynthesis was performed. The images were evaluated with computer-aided detection. COMPARISON:  Previous exam(s). ACR Breast Density Category b: There are scattered areas of fibroglandular density. FINDINGS: There are no findings suspicious for malignancy. IMPRESSION: No mammographic evidence of malignancy. A result letter of this screening mammogram will be mailed directly to the patient. RECOMMENDATION: Screening mammogram in one year. (Code:[id number]) BI-RADS CATEGORY  1: Negative. Electronically Signed   By: Sherron Ales M.D.   On: 09/05/2021 10:05   MS DIGITAL SCREENING TOMO BILATERAL  Result Date: 03/14/2020 CLINICAL DATA:  Screening. EXAM: DIGITAL SCREENING BILATERAL MAMMOGRAM WITH TOMO AND CAD COMPARISON:  Previous exam(s). ACR Breast Density Category b: There are scattered areas of fibroglandular density. FINDINGS: There are no findings suspicious for malignancy. Images were processed with CAD. IMPRESSION: No mammographic evidence of malignancy. A result letter of this  screening mammogram will be mailed directly to the patient. RECOMMENDATION: Screening mammogram in one year. (Code:[id number]) BI-RADS CATEGORY  1: Negative. Electronically Signed   By: Amie Portland M.D.   On: 03/14/2020 11:08   MM DIAG BREAST TOMO UNI LEFT  Result Date: 01/14/2018 CLINICAL DATA:  47 year old patient presents for follow-up of probably benign asymmetry in the slightly medial/central left breast seen in the CC view on her baseline mammogram August 12, 2017. Spanish interpreter was present to give the patient's results to her today. EXAM: DIGITAL DIAGNOSTIC UNILATERAL LEFT MAMMOGRAM WITH CAD AND TOMO COMPARISON:  August 12, 2017 ACR Breast Density Category b: There are scattered areas of fibroglandular density. FINDINGS: The previously described asymmetry in the slightly medial/central left breast in January 2019 is not apparent today. Normal scattered fibroglandular densities are seen in the left breast. No mass, persistent asymmetry, or architectural distortion is identified. There are no suspicious microcalcifications Mammographic images were processed with CAD. IMPRESSION: No evidence of malignancy in the left breast RECOMMENDATION: Bilateral screening mammogram is recommended in January 2020 I have discussed the findings and recommendations with the patient. Results were also provided in writing at the conclusion of the visit. If applicable, a reminder letter will be sent to the patient regarding the next appointment. BI-RADS CATEGORY  1: Negative. Electronically Signed   By: Britta Mccreedy M.D.   On: 01/14/2018 10:37       Pelvic/Bimanual Pap is not indicated today   no Smoking History: Patient has is a former smoker and was not referred to quit line.    Patient Navigation: Patient education provided. Access to services provided for patient through [facility] program. [name]  interpreter provided. No transportation provided   Colorectal Cancer Screening: Per patient has never had  colonoscopy completed No complaints today. FIT test negative in  October 2023   Breast and Cervical Cancer Risk Assessment: Patient does not have family history of breast cancer, known genetic mutations, or radiation treatment to the chest before age 52. 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  Risk Assessment   No risk assessment data for the current encounter  Risk Scores       09/04/2021   Last edited by: Narda Rutherford, LPN   5-year risk: 0.8 %   Lifetime risk: 8 %            A: BCCCP exam without pap smear No complaints with benign exam.   P: Referred patient to the [facility] of [facility] for a screening mammogram. Appointment scheduled 11/19/22.  Pascal Lux, NP 11/19/2022 9:23 AM

## 2022-11-19 NOTE — Patient Instructions (Signed)
Taught [name] how to perform BSE and gave educational materials to take home. Patient did not need a Pap smear today due to last Pap smear was in 2023 per patient. Let her know BCCCP will cover Pap smears every 3 years unless has a history of abnormal Pap smears. Referred patient to the [facility] of [facility] for screening mammogram. Appointment scheduled for 11/19/22. Patient aware of appointment and will be there. Let patient know will follow up with her within the next couple weeks with results. Melinda Oneal verbalized understanding.  Pascal Lux, NP @T @ 9:25 AM

## 2023-09-15 ENCOUNTER — Other Ambulatory Visit: Payer: Self-pay

## 2023-09-15 DIAGNOSIS — Z1231 Encounter for screening mammogram for malignant neoplasm of breast: Secondary | ICD-10-CM

## 2023-10-26 ENCOUNTER — Ambulatory Visit: Payer: No Typology Code available for payment source

## 2023-11-08 ENCOUNTER — Other Ambulatory Visit (HOSPITAL_COMMUNITY): Payer: Self-pay

## 2023-11-25 ENCOUNTER — Ambulatory Visit
Admission: RE | Admit: 2023-11-25 | Discharge: 2023-11-25 | Disposition: A | Discharge status: Home / Self Care | Payer: No Typology Code available for payment source | Source: Ambulatory Visit | Attending: Obstetrics and Gynecology | Admitting: Obstetrics and Gynecology

## 2023-11-25 ENCOUNTER — Ambulatory Visit: Payer: Self-pay | Admitting: Hematology and Oncology

## 2023-11-25 VITALS — BP 137/75 | Wt 172.0 lb

## 2023-11-25 DIAGNOSIS — Z1231 Encounter for screening mammogram for malignant neoplasm of breast: Secondary | ICD-10-CM

## 2023-11-25 NOTE — Patient Instructions (Signed)
 Taught Misk Samples about self breast awareness and gave educational materials to take home. Patient did not need a Pap smear today due to last Pap smear was in 09/04/2021 per patient. Told patient about free cervical cancer screenings to receive a Pap smear if would like one next year. Let her know BCCCP will cover Pap smears every 5 years unless has a history of abnormal Pap smears. Referred patient to the [facility] of [facility] for diagnostic mammogram. Appointment scheduled for 11/25/2023. Patient aware of appointment and will be there. Let patient know will follow up with her within the next couple weeks with results. Melinda Oneal verbalized understanding.  Adelaide Adjutant, NP 9:51 AM

## 2023-11-25 NOTE — Progress Notes (Signed)
 [name] is a 48 y.o. female who presents to [facility] clinic today with no complaints.    Pap Smear: Pap not smear completed today. Last Pap smear was 09/04/2021 and was normal. Per patient has no history of an abnormal Pap smear. Last Pap smear result is available in Epic.   Physical exam: Breasts Breasts symmetrical. No skin abnormalities bilateral breasts. No nipple retraction bilateral breasts. No nipple discharge bilateral breasts. No lymphadenopathy. No lumps palpated bilateral breasts.       Pelvic/Bimanual Pap is not indicated today    Smoking History: Patient has never smoked and was not referred to quit line.    Patient Navigation: Patient education provided. Access to services provided for patient through [facility]CP program. Melinda Oneal interpreter provided. No transportation provided   Colorectal Cancer Screening: Per patient has never had colonoscopy completed No complaints today.    Breast and Cervical Cancer Risk Assessment: Patient does not have family history of breast cancer, known genetic mutations, or radiation treatment to the chest before age 60. Patient does not have history of cervical dysplasia, immunocompromised, or DES exposure in-utero.  Risk Scores as of Encounter on 11/25/2023     Melinda Oneal           5-year 1.18%   Lifetime 10.82%   This patient is [place] but has no documented birth country, so the [place] model used data from [place] patients to calculate their risk score. Document a birth country in the Demographics activity for a more accurate score.         Last calculated by Melinda Oneal, CMA on 11/25/2023 at  9:51 AM          A: BCCCP exam without pap smear No complaints with benign exam.   P: Referred patient to the [facility] for a screening mammogram. Appointment scheduled 11/25/2023.  Melinda Adjutant, NP 11/25/2023 9:49 AM
